# Patient Record
Sex: Male | Born: 1990 | Race: Black or African American | Hispanic: No | Marital: Single | Smoking: Current every day smoker
Health system: Southern US, Community
[De-identification: ages and names within clinical notes are randomized; demographics above are authoritative.]

## PROBLEM LIST (undated history)

## (undated) DIAGNOSIS — J189 Pneumonia, unspecified organism: Secondary | ICD-10-CM

---
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
## 2011-05-05 ENCOUNTER — Emergency Department (HOSPITAL_COMMUNITY): Payer: Medicaid Other

## 2011-05-05 ENCOUNTER — Emergency Department (HOSPITAL_COMMUNITY)
Admission: EM | Admit: 2011-05-05 | Discharge: 2011-05-06 | Disposition: A | Discharge status: Home / Self Care | Payer: Medicaid Other | Attending: Emergency Medicine | Admitting: Emergency Medicine

## 2011-05-05 ENCOUNTER — Encounter (HOSPITAL_COMMUNITY): Payer: Self-pay

## 2011-05-05 DIAGNOSIS — R197 Diarrhea, unspecified: Secondary | ICD-10-CM | POA: Insufficient documentation

## 2011-05-05 DIAGNOSIS — M549 Dorsalgia, unspecified: Secondary | ICD-10-CM | POA: Insufficient documentation

## 2011-05-05 DIAGNOSIS — R109 Unspecified abdominal pain: Secondary | ICD-10-CM | POA: Insufficient documentation

## 2011-05-05 DIAGNOSIS — Z8701 Personal history of pneumonia (recurrent): Secondary | ICD-10-CM | POA: Insufficient documentation

## 2011-05-05 DIAGNOSIS — R112 Nausea with vomiting, unspecified: Secondary | ICD-10-CM | POA: Insufficient documentation

## 2011-05-05 DIAGNOSIS — R509 Fever, unspecified: Secondary | ICD-10-CM | POA: Insufficient documentation

## 2011-05-05 HISTORY — DX: Pneumonia, unspecified organism: J18.9

## 2011-05-05 LAB — COMPREHENSIVE METABOLIC PANEL
ALT: 20 U/L (ref 0–53)
Alkaline Phosphatase: 98 U/L (ref 39–117)
GFR calc Af Amer: >90 mL/min (ref >90)
Glucose, Bld: 88 mg/dL (ref 70–99)
Potassium: 3.3 mEq/L — ABNORMAL LOW (ref 3.5–5.1)
Sodium: 134 mEq/L — ABNORMAL LOW (ref 135–145)
Total Protein: 8.2 g/dL (ref 6.0–8.3)

## 2011-05-05 LAB — URINALYSIS, ROUTINE W REFLEX MICROSCOPIC
Bilirubin Urine: NEGATIVE
Glucose, UA: NEGATIVE mg/dL
Ketones, ur: >80 mg/dL — AB
Nitrite: NEGATIVE
pH: 6 (ref 5.0–8.0)

## 2011-05-05 LAB — URINE MICROSCOPIC-ADD ON

## 2011-05-05 LAB — DIFFERENTIAL
Eosinophils Absolute: 0 10*3/uL (ref 0.0–0.7)
Lymphocytes Relative: 7 % — ABNORMAL LOW (ref 12–46)
Lymphs Abs: 0.9 10*3/uL (ref 0.7–4.0)
Neutro Abs: 12.2 10*3/uL — ABNORMAL HIGH (ref 1.7–7.7)
Neutrophils Relative %: 87 % — ABNORMAL HIGH (ref 43–77)

## 2011-05-05 LAB — CBC
MCH: 31.2 pg (ref 26.0–34.0)
Platelets: 182 10*3/uL (ref 150–400)
RBC: 5.57 MIL/uL (ref 4.22–5.81)
WBC: 14.1 10*3/uL — ABNORMAL HIGH (ref 4.0–10.5)

## 2011-05-05 MED ORDER — ONDANSETRON HCL 4 MG/2ML IJ SOLN
4.0000 mg | INTRAMUSCULAR | Status: DC | PRN
  Administered 2011-05-05: 4 mg via INTRAVENOUS
  Filled 2011-05-05: qty 2

## 2011-05-05 MED ORDER — DICYCLOMINE HCL 20 MG PO TABS: 20.0000 mg | ORAL_TABLET | Freq: Four times a day (QID) | ORAL | Status: DC | PRN

## 2011-05-05 MED ORDER — MORPHINE SULFATE 4 MG/ML IJ SOLN
4.0000 mg | INTRAMUSCULAR | Status: DC | PRN
  Administered 2011-05-05: 4 mg via INTRAVENOUS
  Filled 2011-05-05: qty 1

## 2011-05-05 MED ORDER — DICYCLOMINE HCL 20 MG PO TABS: 20.0000 mg | ORAL_TABLET | Freq: Four times a day (QID) | ORAL | Status: DC

## 2011-05-05 MED ORDER — DICYCLOMINE HCL 10 MG/ML IM SOLN
20.0000 mg | Freq: Once | INTRAMUSCULAR | Status: AC
  Administered 2011-05-05: 20 mg via INTRAMUSCULAR
  Filled 2011-05-05: qty 4

## 2011-05-05 MED ORDER — FAMOTIDINE IN NACL 20-0.9 MG/50ML-% IV SOLN
20.0000 mg | Freq: Once | INTRAVENOUS | Status: AC
  Administered 2011-05-05: 20 mg via INTRAVENOUS
  Filled 2011-05-05: qty 50

## 2011-05-05 MED ORDER — SODIUM CHLORIDE 0.9 % IV BOLUS (SEPSIS)
500.0000 mL | Freq: Once | INTRAVENOUS | Status: AC
  Administered 2011-05-05: 500 mL via INTRAVENOUS

## 2011-05-05 MED ORDER — ONDANSETRON HCL 4 MG PO TABS: 4.0000 mg | ORAL_TABLET | Freq: Four times a day (QID) | ORAL | Status: AC | PRN

## 2011-05-05 NOTE — ED Notes (Signed)
Acute onset on Friday with symptoms

## 2011-05-05 NOTE — ED Provider Notes (Signed)
History     CSN: [id number]  Arrival date & time 05/05/11  4098   First MD Initiated Contact with Patient 05/05/11 1917      Chief Complaint  Patient presents with  . Abdominal Pain  . Nausea  . Emesis  . Diarrhea  . Fever  . Back Pain    HPI Pt was seen at 2015.  Per pt, c/o gradual onset and persistence of multiple intermittent episodes of N/V/D x2 days.  Has been assoc with generalized abd "pain."  Describes the abd pain as "cramping."  Denies fevers, no rash, no CP/SOB, no back pain, no black or blood in stools or emesis.     Past Medical History  Diagnosis Date  . Pneumonia     History reviewed. No pertinent past surgical history.   History  Substance Use Topics  . Smoking status: Current Everyday Smoker  . Smokeless tobacco: Not on file  . Alcohol Use: Yes    Review of Systems ROS: Statement: All systems negative except as marked or noted in the HPI; Constitutional: Negative for fever and chills. ; ; Eyes: Negative for eye pain, redness and discharge. ; ; ENMT: Negative for ear pain, hoarseness, nasal congestion, sinus pressure and sore throat. ; ; Cardiovascular: Negative for chest pain, palpitations, diaphoresis, dyspnea and peripheral edema. ; ; Respiratory: Negative for cough, wheezing and stridor. ; ; Gastrointestinal: +N/V/D, abd pain. Negative for blood in stool, hematemesis, jaundice and rectal bleeding. . ; ; Genitourinary: Negative for dysuria, flank pain and hematuria. ; Genital:  No penile drainage or rash, no testicular pain or swelling, no scrotal rash or swelling. ; Musculoskeletal: Negative for back pain and neck pain. Negative for swelling and trauma.; ; Skin: Negative for pruritus, rash, abrasions, blisters, bruising and skin lesion.; ; Neuro: Negative for headache, lightheadedness and neck stiffness. Negative for weakness, altered level of consciousness , altered mental status, extremity weakness, paresthesias, involuntary movement, seizure and syncope.         Allergies  Review of patient's allergies indicates no known allergies.  Home Medications   Current Outpatient Rx  Name Route Sig Dispense Refill  . IBUPROFEN 200 MG PO TABS Oral Take 200 mg by mouth every 6 (six) hours as needed. For pain relief    . PSEUDOEPHEDRINE-APAP-DM 11-914-78 MG/30ML PO LIQD Oral Take 2 capsules by mouth every 4 (four) hours as needed. For cold/flu symptom relief      BP 150/76  Pulse 83  Temp(Src) 99.8 F (37.7 C) (Oral)  Resp 16  SpO2 100%  Physical Exam 2020: Physical examination:  Nursing notes reviewed; Vital signs and O2 SAT reviewed;  Constitutional: Well developed, Well nourished, Well hydrated, In no acute distress; Head:  Normocephalic, atraumatic; Eyes: EOMI, PERRL, No scleral icterus; ENMT: Mouth and pharynx normal, Mucous membranes moist; Neck: Supple, Full range of motion, No lymphadenopathy; Cardiovascular: Regular rate and rhythm, No murmur, rub, or gallop; Respiratory: Breath sounds clear & equal bilaterally, No rales, rhonchi, wheezes, or rub, Normal respiratory effort/excursion; Chest: Nontender, Movement normal; Abdomen: Soft, +mild diffuse tenderness to palp, no rebound or guarding, Nondistended, Normal bowel sounds; Genitourinary: No CVA tenderness; Extremities: Pulses normal, No tenderness, No edema, No calf edema or asymmetry.; Neuro: AA&Ox3, Major CN grossly intact.  No gross focal motor or sensory deficits in extremities.; Skin: Color normal, Warm, Dry, no rash.    ED Course  Procedures    MDM  MDM Reviewed: nursing note and vitals Interpretation: labs and x-ray   Results  for orders placed during the hospital encounter of 05/05/11  CBC      Component Value Range   WBC 14.1 (*) 4.0 - 10.5 (K/uL)   RBC 5.57  4.22 - 5.81 (MIL/uL)   Hemoglobin 17.4 (*) 13.0 - 17.0 (g/dL)   HCT 96.0  45.4 - 09.8 (%)   MCV 86.7  78.0 - 100.0 (fL)   MCH 31.2  26.0 - 34.0 (pg)   MCHC 36.0  30.0 - 36.0 (g/dL)   RDW 11.9  14.7 - 82.9 (%)    Platelets 182  150 - 400 (K/uL)  DIFFERENTIAL      Component Value Range   Neutrophils Relative 87 (*) 43 - 77 (%)   Neutro Abs 12.2 (*) 1.7 - 7.7 (K/uL)   Lymphocytes Relative 7 (*) 12 - 46 (%)   Lymphs Abs 0.9  0.7 - 4.0 (K/uL)   Monocytes Relative 7  3 - 12 (%)   Monocytes Absolute 1.0  0.1 - 1.0 (K/uL)   Eosinophils Relative 0  0 - 5 (%)   Eosinophils Absolute 0.0  0.0 - 0.7 (K/uL)   Basophils Relative 0  0 - 1 (%)   Basophils Absolute 0.0  0.0 - 0.1 (K/uL)  COMPREHENSIVE METABOLIC PANEL      Component Value Range   Sodium 134 (*) 135 - 145 (mEq/L)   Potassium 3.3 (*) 3.5 - 5.1 (mEq/L)   Chloride 97  96 - 112 (mEq/L)   CO2 25  19 - 32 (mEq/L)   Glucose, Bld 88  70 - 99 (mg/dL)   BUN 9  6 - 23 (mg/dL)   Creatinine, Ser 5.62  0.50 - 1.35 (mg/dL)   Calcium 9.8  8.4 - 13.0 (mg/dL)   Total Protein 8.2  6.0 - 8.3 (g/dL)   Albumin 4.3  3.5 - 5.2 (g/dL)   AST 23  0 - 37 (U/L)   ALT 20  0 - 53 (U/L)   Alkaline Phosphatase 98  39 - 117 (U/L)   Total Bilirubin 0.4  0.3 - 1.2 (mg/dL)   GFR calc non Af Amer >90  >90 (mL/min)   GFR calc Af Amer >90  >90 (mL/min)  URINALYSIS, ROUTINE W REFLEX MICROSCOPIC      Component Value Range   Color, Urine AMBER (*) YELLOW    APPearance CLEAR  CLEAR    Specific Gravity, Urine 1.025  1.005 - 1.030    pH 6.0  5.0 - 8.0    Glucose, UA NEGATIVE  NEGATIVE (mg/dL)   Hgb urine dipstick SMALL (*) NEGATIVE    Bilirubin Urine NEGATIVE  NEGATIVE    Ketones, ur >80 (*) NEGATIVE (mg/dL)   Protein, ur NEGATIVE  NEGATIVE (mg/dL)   Urobilinogen, UA 0.2  0.0 - 1.0 (mg/dL)   Nitrite NEGATIVE  NEGATIVE    Leukocytes, UA NEGATIVE  NEGATIVE   URINE MICROSCOPIC-ADD ON      Component Value Range   WBC, UA 0-2  <3 (WBC/hpf)   Bacteria, UA RARE  RARE    Urine-Other MUCOUS PRESENT    LIPASE, BLOOD      Component Value Range   Lipase 15  11 - 59 (U/L)   Dg Abd Acute W/chest 05/05/2011  *RADIOLOGY REPORT*  Clinical Data: Abdominal pain with nausea, vomiting,  diarrhea, back pain, and fever.  ACUTE ABDOMEN SERIES (ABDOMEN 2 VIEW & CHEST 1 VIEW)  Comparison: Chest x-ray dated 07/08/2010 and lumbar radiographs dated 09/01/2007  Findings: Heart and lungs appear normal.  No  free air in the abdomen.  No significantly dilated loops of large or small bowel. No abnormal abdominal calcifications.  No acute osseous abnormality.  IMPRESSION: No acute abnormality of the abdomen or chest.  Original Report Authenticated By: Gwynn Burly, M.D.       11:27 PM:  Feels better and wants to go home now.  Has tol PO well without N/V.  Has ambulated around the ED without distress.  Dx testing d/w pt and family.  Questions answered.  Verb understanding, agreeable to d/c home with outpt f/u.          Laray Anger, DO 05/07/11 2348

## 2011-05-06 ENCOUNTER — Emergency Department (HOSPITAL_COMMUNITY)
Admission: EM | Admit: 2011-05-06 | Discharge: 2011-05-06 | Discharge status: Against Medical Advice | Payer: Medicaid Other | Attending: Emergency Medicine | Admitting: Emergency Medicine

## 2011-05-06 ENCOUNTER — Encounter (HOSPITAL_COMMUNITY): Payer: Self-pay | Admitting: Emergency Medicine

## 2011-05-06 DIAGNOSIS — R109 Unspecified abdominal pain: Secondary | ICD-10-CM | POA: Insufficient documentation

## 2011-05-06 NOTE — ED Notes (Signed)
Pt not available when this RN attempt to room pt

## 2011-05-06 NOTE — ED Notes (Signed)
Pt alert, nad, c/o lower abd pain, cramping, nausea, seen in ED yesterday, returns via EMS with cont c/o, resp even unlabored, skin pwd

## 2011-05-06 NOTE — ED Notes (Signed)
[id number]> Expected date:05/06/11<BR> Expected time:<BR> Means of arrival:<BR> Comments:<BR> EMS 33 Ptar - abd pain/ambulatory

## 2011-05-07 LAB — URINE CULTURE
Colony Count: NO GROWTH
Culture  Setup Time: 201303040431

## 2013-04-02 IMAGING — CR DG ABDOMEN ACUTE W/ 1V CHEST
3 series · 3 of 3 positions shown · non-contrast
Comparison: Chest x-ray dated 07/08/2010 and lumbar radiographs
dated 09/01/2007

CLINICAL DATA: Abdominal pain with nausea, vomiting, diarrhea, back
pain, and fever.

ACUTE ABDOMEN SERIES (ABDOMEN 2 VIEW & CHEST 1 VIEW)

[w chest pa]
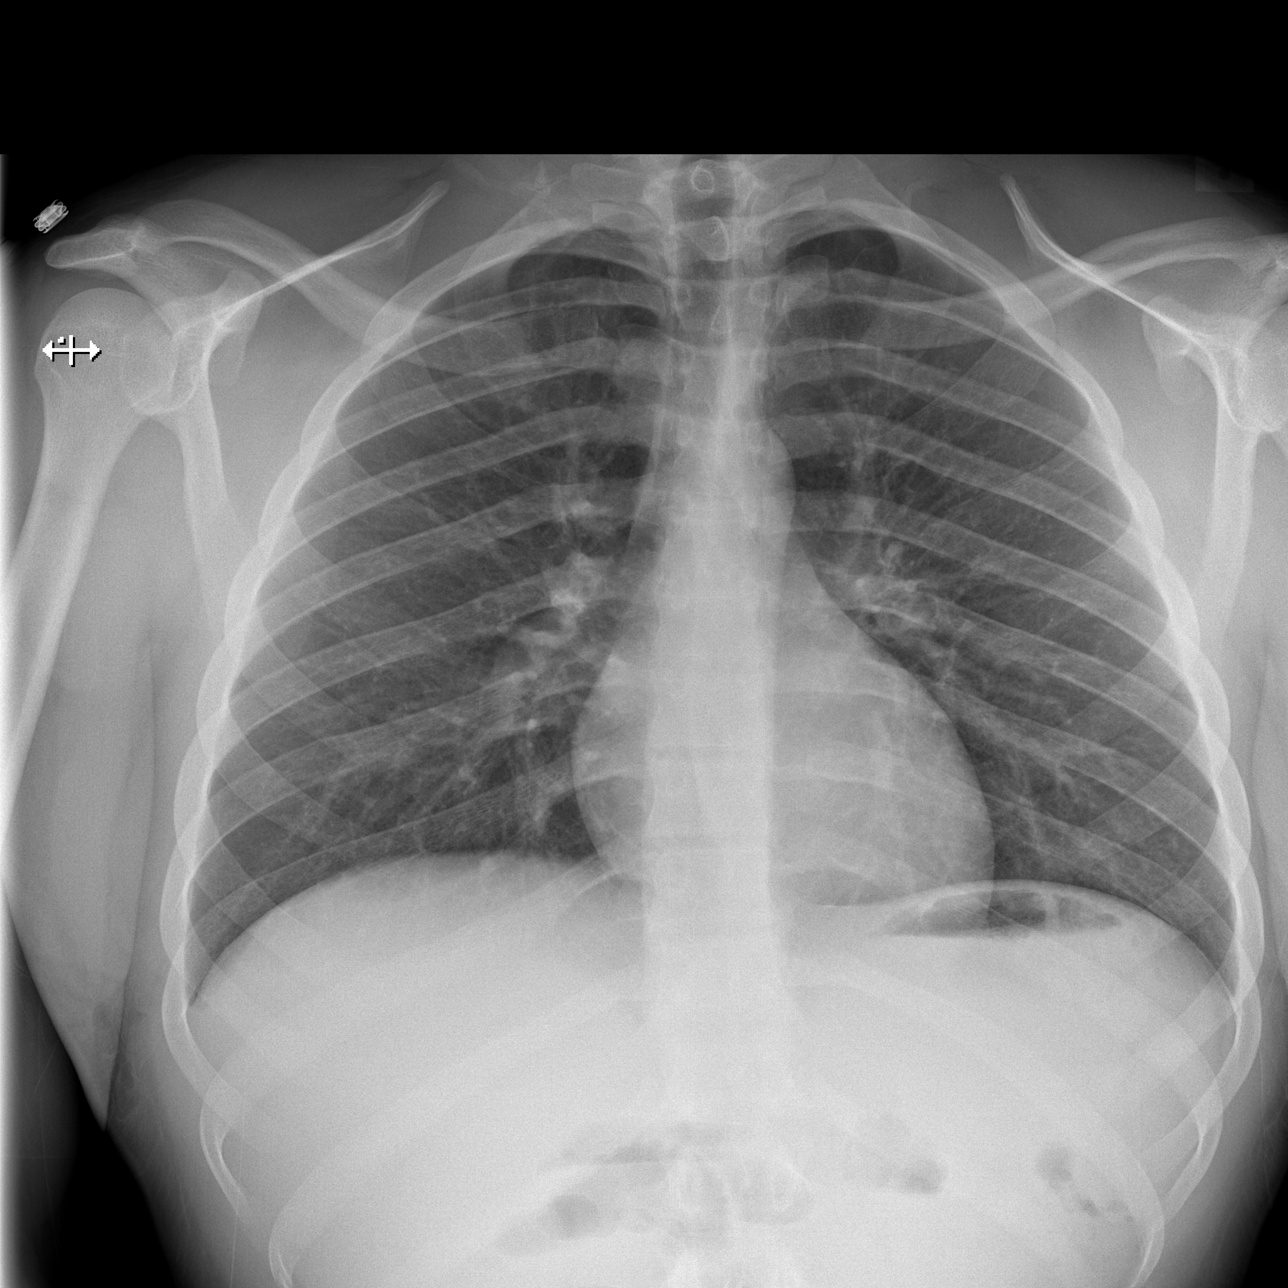

[w abdomen upright]
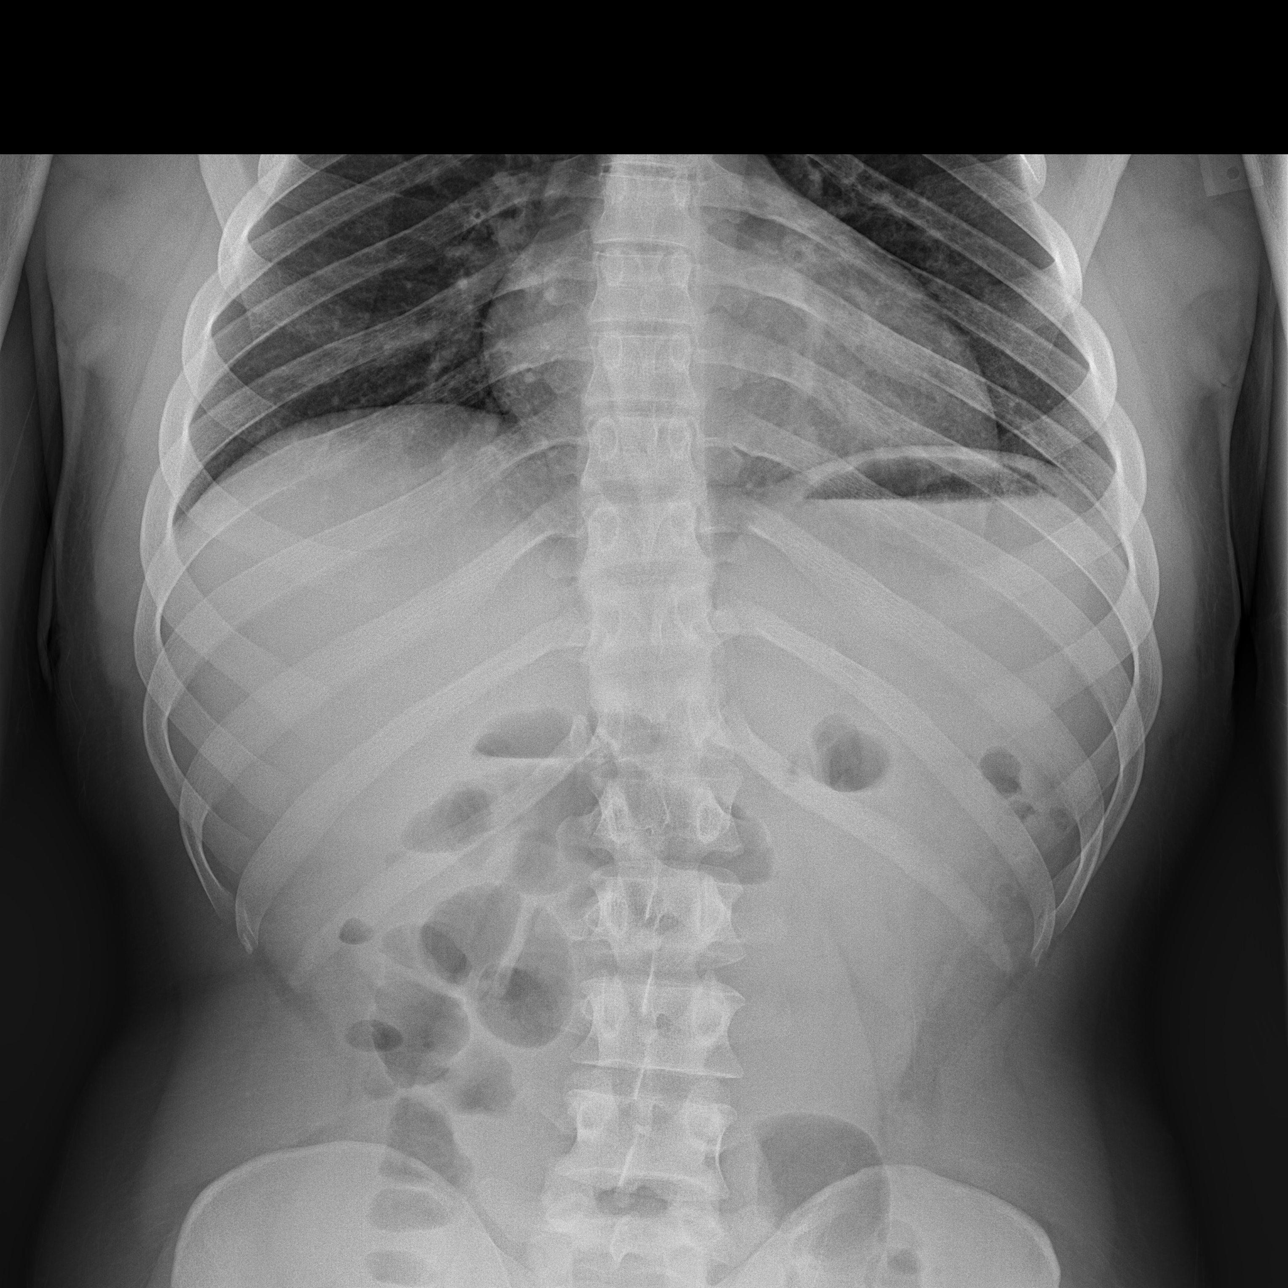

[t abdomen supine]
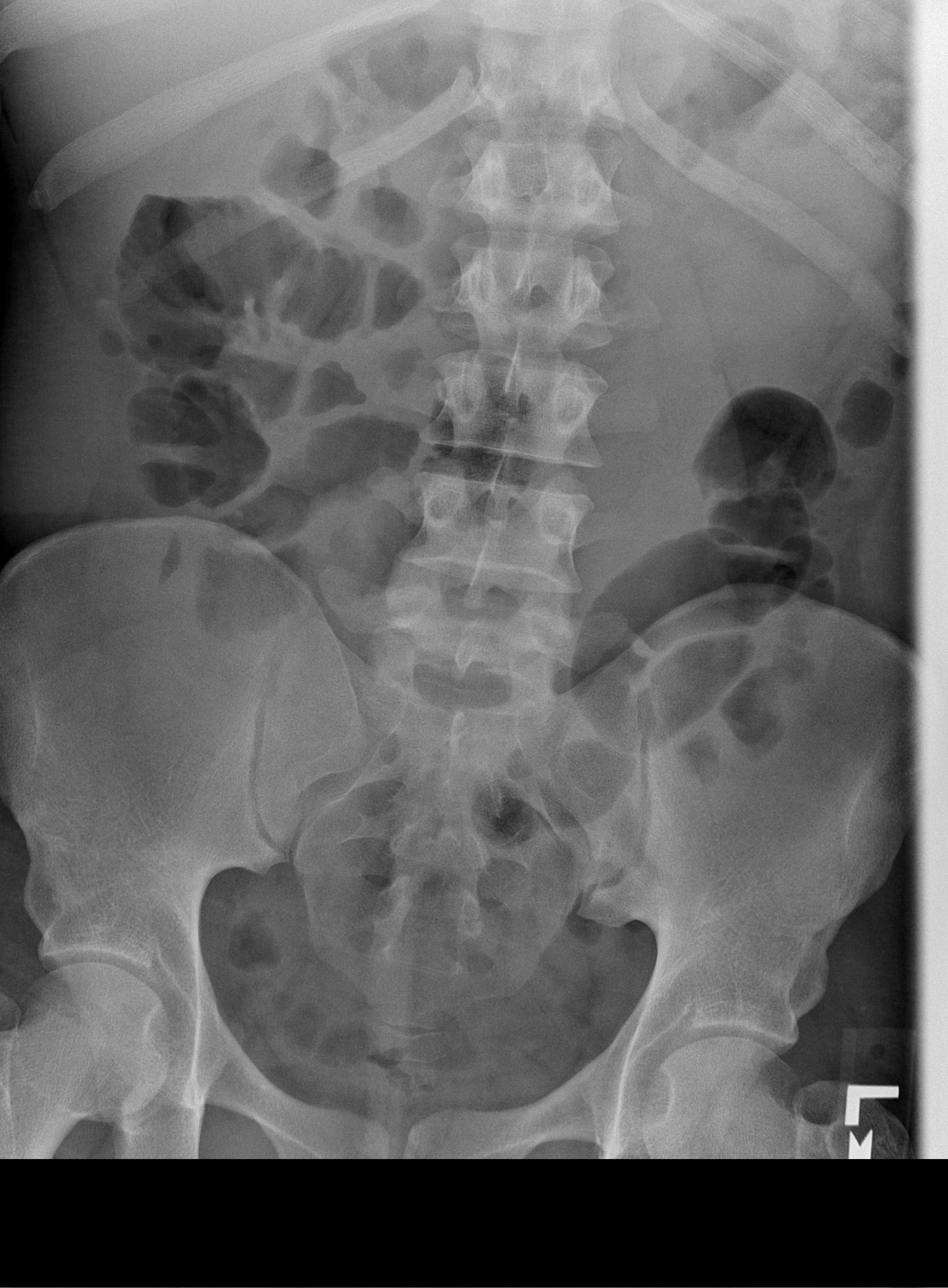

[3 of 3 positions shown; findings below may reference images not displayed]

FINDINGS: Heart and lungs appear normal.  No free air in the
abdomen.  No significantly dilated loops of large or small bowel.
No abnormal abdominal calcifications.  No acute osseous
abnormality.
IMPRESSION: No acute abnormality of the abdomen or chest.

## 2016-12-29 ENCOUNTER — Encounter (HOSPITAL_COMMUNITY): Payer: Self-pay

## 2016-12-29 ENCOUNTER — Emergency Department (HOSPITAL_COMMUNITY)
Admission: EM | Admit: 2016-12-29 | Discharge: 2016-12-29 | Disposition: A | Discharge status: Home / Self Care | Payer: Self-pay | Attending: Emergency Medicine | Admitting: Emergency Medicine

## 2016-12-29 DIAGNOSIS — M7918 Myalgia, other site: Secondary | ICD-10-CM | POA: Insufficient documentation

## 2016-12-29 DIAGNOSIS — F17210 Nicotine dependence, cigarettes, uncomplicated: Secondary | ICD-10-CM | POA: Insufficient documentation

## 2016-12-29 LAB — URINALYSIS, ROUTINE W REFLEX MICROSCOPIC
Bilirubin Urine: NEGATIVE
Glucose, UA: NEGATIVE mg/dL
Hgb urine dipstick: NEGATIVE
KETONES UR: NEGATIVE mg/dL
NITRITE: NEGATIVE
PH: 6.5 (ref 5.0–8.0)
PROTEIN: NEGATIVE mg/dL
Specific Gravity, Urine: 1.015 (ref 1.005–1.030)

## 2016-12-29 LAB — URINALYSIS, MICROSCOPIC (REFLEX)
Bacteria, UA: NONE SEEN
RBC / HPF: NONE SEEN RBC/hpf (ref 0–5)

## 2016-12-29 MED ORDER — IBUPROFEN 600 MG PO TABS: 600.0000 mg | ORAL_TABLET | Freq: Four times a day (QID) | ORAL | 0 refills | Status: AC | PRN

## 2016-12-29 MED ORDER — KETOROLAC TROMETHAMINE 60 MG/2ML IM SOLN
60.0000 mg | Freq: Once | INTRAMUSCULAR | Status: AC
  Administered 2016-12-29: 60 mg via INTRAMUSCULAR
  Filled 2016-12-29: qty 2

## 2016-12-29 MED ORDER — DIAZEPAM 5 MG PO TABS
5.0000 mg | ORAL_TABLET | Freq: Once | ORAL | Status: AC
  Administered 2016-12-29: 5 mg via ORAL
  Filled 2016-12-29: qty 1

## 2016-12-29 MED ORDER — METAXALONE 800 MG PO TABS: 800.0000 mg | ORAL_TABLET | Freq: Three times a day (TID) | ORAL | 0 refills | Status: DC

## 2016-12-29 NOTE — ED Notes (Signed)
ED Provider at bedside. 

## 2016-12-29 NOTE — ED Provider Notes (Signed)
[facility]-EMERGENCY DEPT Provider Note   CSN: [id number] Arrival date & time: 12/29/16  [id number]     History   Chief Complaint Chief Complaint  Patient presents with  . Flank Pain    Right Side    HPI Richard Patel is a 26 y.o. male.  26 year old male presents with several days of right-sided flank pain which is worse with certain positions and better with remaining still.  Denies any dysuria or hematuria.  Denies any history of trauma.  No radicular symptoms.  He notices the pain during his sleep when he changes positions.  Has used Tylenol without relief.  Denies any prior history of back injury or kidney stone.  He has not been short of breath has not had any right-sided chest pain.  Denies any rashes.  Called EMS and was transported here      Past Medical History:  Diagnosis Date  . Pneumonia     There are no active problems to display for this patient.   History reviewed. No pertinent surgical history.     Home Medications    Prior to Admission medications   Medication Sig Start Date End Date Taking? Authorizing Provider  dicyclomine (BENTYL) 20 MG tablet Take 1 tablet (20 mg total) by mouth every 6 (six) hours as needed (abdominal cramping). 05/05/11 05/04/12  Samuel JesterMcManus, Kathleen, DO  ibuprofen (ADVIL,MOTRIN) 200 MG tablet Take 200 mg by mouth every 6 (six) hours as needed. For pain relief    [provider]    Family History No family history on file.  Social History Social History  Substance Use Topics  . Smoking status: Current Every Day Smoker    Packs/day: 0.50    Years: 10.00    Types: Cigarettes  . Smokeless tobacco: Never Used     Comment: Will provide information on discharge  . Alcohol use No     Allergies   Patient has no known allergies.   Review of Systems Review of Systems  All other systems reviewed and are negative.    Physical Exam Updated Vital Signs BP 117/82 (BP Location: Left Arm)   Pulse  68   Temp 98.2 F (36.8 C) (Oral)   Resp 18   Ht 1.753 m (5\' 9" )   Wt 127 kg (280 lb)   SpO2 100%   BMI 41.35 kg/m   Physical Exam  Constitutional: He is oriented to person, place, and time. He appears well-developed and well-nourished.  Non-toxic appearance. No distress.  HENT:  Head: Normocephalic and atraumatic.  Eyes: Pupils are equal, round, and reactive to light. Conjunctivae, EOM and lids are normal.  Neck: Normal range of motion. Neck supple. No tracheal deviation present. No thyroid mass present.  Cardiovascular: Normal rate, regular rhythm and normal heart sounds.  Exam reveals no gallop.   No murmur heard. Pulmonary/Chest: Effort normal and breath sounds normal. No stridor. No respiratory distress. He has no decreased breath sounds. He has no wheezes. He has no rhonchi. He has no rales.  Abdominal: Soft. Normal appearance and bowel sounds are normal. He exhibits no distension. There is no tenderness. There is no rebound and no CVA tenderness.  Musculoskeletal: Normal range of motion. He exhibits no edema or tenderness.       Back:  Neurological: He is alert and oriented to person, place, and time. He has normal strength. No cranial nerve deficit or sensory deficit. GCS eye subscore is 4. GCS verbal subscore is 5. GCS motor subscore  is 6.  Skin: Skin is warm and dry. No abrasion and no rash noted.  Psychiatric: He has a normal mood and affect. His speech is normal and behavior is normal.  Nursing note and vitals reviewed.    ED Treatments / Results  Labs (all labs ordered are listed, but only abnormal results are displayed) Labs Reviewed  URINALYSIS, ROUTINE W REFLEX MICROSCOPIC    EKG  EKG Interpretation None       Radiology No results found.  Procedures Procedures (including critical care time)  Medications Ordered in ED Medications  ketorolac (TORADOL) injection 60 mg (not administered)  diazepam (VALIUM) tablet 5 mg (not administered)     Initial  Impression / Assessment and Plan / ED Course  I have reviewed the triage vital signs and the nursing notes.  Pertinent labs & imaging results that were available during my care of the patient were reviewed by me and considered in my medical decision making (see chart for details).     Patient treated for musculoskeletal back pain and feels better.  No urinary symptoms.  Will be discharged  Final Clinical Impressions(s) / ED Diagnoses   Final diagnoses:  None    New Prescriptions New Prescriptions   No medications on file     Lorre Nick, MD 12/29/16 1157

## 2016-12-29 NOTE — ED Notes (Signed)
Patient requesting to sleep alittle longer before giving urine sample.

## 2016-12-29 NOTE — ED Notes (Signed)
Pt denies SOB, Chest pain, nausea, vomiting.

## 2016-12-29 NOTE — ED Notes (Signed)
Bed: WA19 Expected date:  Expected time:  Means of arrival:  Comments: EMS 

## 2016-12-29 NOTE — ED Triage Notes (Signed)
Pt arrived Via [facility] w/ right sided flank pain. Pain has been going on for the past 3 days. Pain is worse in the morning and gradual decreases during the day but does not completely go away. Pt does on have Hx of Renal calculi, pt states pain is not relieved with Tylenol.

## 2017-09-18 ENCOUNTER — Ambulatory Visit (HOSPITAL_COMMUNITY)
Admission: EM | Admit: 2017-09-18 | Discharge: 2017-09-18 | Disposition: A | Discharge status: Home / Self Care | Payer: Self-pay | Attending: Family Medicine | Admitting: Family Medicine

## 2017-09-18 ENCOUNTER — Encounter (HOSPITAL_COMMUNITY): Payer: Self-pay | Admitting: Emergency Medicine

## 2017-09-18 ENCOUNTER — Ambulatory Visit (INDEPENDENT_AMBULATORY_CARE_PROVIDER_SITE_OTHER): Payer: Self-pay

## 2017-09-18 DIAGNOSIS — M25561 Pain in right knee: Secondary | ICD-10-CM

## 2017-09-18 MED ORDER — DICLOFENAC SODIUM 75 MG PO TBEC: 75.0000 mg | DELAYED_RELEASE_TABLET | Freq: Two times a day (BID) | ORAL | 0 refills | Status: DC

## 2017-09-18 NOTE — ED Triage Notes (Signed)
Pt sts right knee swelling and pain since slipping 4 days ago; pt sts hx of similar in past

## 2017-09-18 NOTE — ED Provider Notes (Addendum)
[facility]    CSN: [id number] Arrival date & time: 09/18/17  1107     History   Chief Complaint Chief Complaint  Patient presents with  . Knee Pain    HPI Richard Patel is a 27 y.o. male history of eczema presenting today for evaluation of right knee pain.  Patient states that years ago he injured his knee and felt like his kneecap was was out of place, he states he did his own physical therapy and symptoms improved.  More recently he fell onto this knee which caused his symptoms to worsen again.  Notes his pain to be mostly by his lower inner thigh.  Difficulty and pain with bending and walking.  He will feel a pop.  HPI  Past Medical History:  Diagnosis Date  . Pneumonia     There are no active problems to display for this patient.   History reviewed. No pertinent surgical history.     Home Medications    Prior to Admission medications   Medication Sig Start Date End Date Taking? Authorizing Provider  acetaminophen (TYLENOL) 500 MG tablet Take 500 mg by mouth every 6 (six) hours as needed for mild pain.    [provider]  diclofenac (VOLTAREN) 75 MG EC tablet Take 1 tablet (75 mg total) by mouth 2 (two) times daily. 09/18/17   [place]op[place] Canty C, PA-C  dicyclomine (BENTYL) 20 MG tablet Take 1 tablet (20 mg total) by mouth every 6 (six) hours as needed (abdominal cramping). 05/05/11 05/04/12  Samuel JesterMcManus, Kathleen, DO  ibuprofen (ADVIL,MOTRIN) 600 MG tablet Take 1 tablet (600 mg total) by mouth every 6 (six) hours as needed. 12/29/16   Lorre NickAllen, Anthony, MD  metaxalone (SKELAXIN) 800 MG tablet Take 1 tablet (800 mg total) by mouth 3 (three) times daily. 12/29/16   Lorre NickAllen, Anthony, MD    Family History History reviewed. No pertinent family history.  Social History Social History   Tobacco Use  . Smoking status: Current Every Day Smoker    Packs/day: 0.50    Years: 10.00    Pack years: 5.00    Types: Cigarettes  . Smokeless tobacco: Never Used  .  Tobacco comment: Will provide information on discharge  Substance Use Topics  . Alcohol use: No  . Drug use: Yes    Types: Marijuana    Comment: Ocassional use     Allergies   Patient has no known allergies.   Review of Systems Review of Systems  Constitutional: Negative for activity change, chills, diaphoresis and fatigue.  Eyes: Negative for photophobia and visual disturbance.  Respiratory: Negative for cough, chest tightness and shortness of breath.   Cardiovascular: Negative for chest pain and leg swelling.  Gastrointestinal: Negative for nausea and vomiting.  Musculoskeletal: Positive for arthralgias, gait problem and myalgias. Negative for back pain, neck pain and neck stiffness.  Skin: Positive for rash. Negative for color change and wound.  Neurological: Negative for dizziness, weakness, light-headedness, numbness and headaches.     Physical Exam Triage Vital Signs ED Triage Vitals [09/18/17 1151]  Enc Vitals Group     BP (!) 150/82     Pulse Rate 81     Resp 16     Temp 98.5 F (36.9 C)     Temp Source Oral     SpO2 99 %     Weight      Height      Head Circumference      Peak Flow  Pain Score      Pain Loc      Pain Edu?      Excl. in GC?    No data found.  Updated Vital Signs BP (!) 150/82 (BP Location: Right Arm)   Pulse 81   Temp 98.5 F (36.9 C) (Oral)   Resp 16   SpO2 99%   Visual Acuity Right Eye Distance:   Left Eye Distance:   Bilateral Distance:    Right Eye Near:   Left Eye Near:    Bilateral Near:     Physical Exam  Constitutional: He is oriented to person, place, and time. He appears well-developed and well-nourished.  No acute distress  HENT:  Head: Normocephalic and atraumatic.  Nose: Nose normal.  Eyes: Conjunctivae are normal.  Neck: Neck supple.  Cardiovascular: Normal rate.  Pulmonary/Chest: Effort normal. No respiratory distress.  Abdominal: He exhibits no distension.  Musculoskeletal: Normal range of motion.   Right knee: Patella less mobile than on left side; limited flexion, patient resisting motion due to pain, no varus or valgus laxity appreciated, negative Lachman, negative anterior and posterior drawer.  Neurological: He is alert and oriented to person, place, and time.  Skin: Skin is warm and dry.  Significant eczematous patches to bilateral lower extremities  Psychiatric: He has a normal mood and affect.  Nursing note and vitals reviewed.    UC Treatments / Results  Labs (all labs ordered are listed, but only abnormal results are displayed) Labs Reviewed - No data to display  EKG None  Radiology Dg Knee Complete 4 Views Right  Result Date: 09/18/2017 CLINICAL DATA:  Knee pain.  Fall 3 days ago EXAM: RIGHT KNEE - COMPLETE 4+ VIEW COMPARISON:  07/19/2005 FINDINGS: Negative for fracture Joint space narrowing and irregularity in the lateral compartment and the patellofemoral joint. Calcified loose bodies in the joint. Moderate joint effusion. IMPRESSION: Moderate degenerative change with calcified loose bodies and joint effusion. Electronically Signed   By: Marlan Palau M.D.   On: 09/18/2017 12:55    Procedures Procedures (including critical care time)  Medications Ordered in UC Medications - No data to display  Initial Impression / Assessment and Plan / UC Course  I have reviewed the triage vital signs and the nursing notes.  Pertinent labs & imaging results that were available during my care of the patient were reviewed by me and considered in my medical decision making (see chart for details).     X-ray showing moderate degenerative changes with loose bodies effusion.  This bodies have likely been there and less likely the cause of his acute pain today.  Will have patient follow-up with orthopedics for further treatment and evaluation.  Diclofenac today, continue anti-inflammatories, rest, ice and elevation.  Weightbearing as tolerating.Discussed strict return precautions.  Patient verbalized understanding and is agreeable with plan.  Final Clinical Impressions(s) / UC Diagnoses   Final diagnoses:  Acute pain of right knee     Discharge Instructions     Please use diclofenac twice daily for pain  Ice and elevate knee  Follow up with orthopedics if pain persisting    ED Prescriptions    Medication Sig Dispense Auth. Provider   diclofenac (VOLTAREN) 75 MG EC tablet Take 1 tablet (75 mg total) by mouth 2 (two) times daily. 30 tablet Destine Zirkle, Kirkwood C, PA-C     Controlled Substance Prescriptions [place] Controlled Substance Registry consulted? Not Applicable   Foy [place] 09/18/17 1321    Keshanna Riso, Bruno  C, PA-C 09/18/17 1321

## 2017-09-18 NOTE — Discharge Instructions (Signed)
Please use diclofenac twice daily for pain  Ice and elevate knee  Follow up with orthopedics if pain persisting

## 2017-09-18 NOTE — ED Notes (Signed)
Knee splint applied by taylor, cna

## 2019-06-28 ENCOUNTER — Emergency Department (HOSPITAL_COMMUNITY): Payer: Self-pay

## 2019-06-28 ENCOUNTER — Emergency Department (HOSPITAL_COMMUNITY)
Admission: EM | Admit: 2019-06-28 | Discharge: 2019-06-28 | Payer: Self-pay | Attending: Emergency Medicine | Admitting: Emergency Medicine

## 2019-06-28 ENCOUNTER — Other Ambulatory Visit: Payer: Self-pay

## 2019-06-28 DIAGNOSIS — Z20822 Contact with and (suspected) exposure to covid-19: Secondary | ICD-10-CM | POA: Insufficient documentation

## 2019-06-28 DIAGNOSIS — F17210 Nicotine dependence, cigarettes, uncomplicated: Secondary | ICD-10-CM | POA: Insufficient documentation

## 2019-06-28 DIAGNOSIS — R0789 Other chest pain: Secondary | ICD-10-CM | POA: Insufficient documentation

## 2019-06-28 DIAGNOSIS — F419 Anxiety disorder, unspecified: Secondary | ICD-10-CM | POA: Insufficient documentation

## 2019-06-28 LAB — TROPONIN I (HIGH SENSITIVITY): Troponin I (High Sensitivity): 3 ng/L (ref <18)

## 2019-06-28 LAB — BASIC METABOLIC PANEL
Anion gap: 12 (ref 5–15)
BUN: 12 mg/dL (ref 6–20)
CO2: 20 mmol/L — ABNORMAL LOW (ref 22–32)
Calcium: 9.3 mg/dL (ref 8.9–10.3)
Chloride: 107 mmol/L (ref 98–111)
Creatinine, Ser: 0.78 mg/dL (ref 0.61–1.24)
GFR calc Af Amer: >60 mL/min (ref >60)
GFR calc non Af Amer: >60 mL/min (ref >60)
Glucose, Bld: 88 mg/dL (ref 70–99)
Potassium: 4 mmol/L (ref 3.5–5.1)
Sodium: 139 mmol/L (ref 135–145)

## 2019-06-28 LAB — CBC
HCT: 51.8 % (ref 39.0–52.0)
Hemoglobin: 17.4 g/dL — ABNORMAL HIGH (ref 13.0–17.0)
MCH: 31.7 pg (ref 26.0–34.0)
MCHC: 33.6 g/dL (ref 30.0–36.0)
MCV: 94.4 fL (ref 80.0–100.0)
Platelets: 199 10*3/uL (ref 150–400)
RBC: 5.49 MIL/uL (ref 4.22–5.81)
RDW: 13.2 % (ref 11.5–15.5)
WBC: 8.2 10*3/uL (ref 4.0–10.5)
nRBC: 0 % (ref 0.0–0.2)

## 2019-06-28 LAB — POC SARS CORONAVIRUS 2 AG -  ED: SARS Coronavirus 2 Ag: NEGATIVE

## 2019-06-28 MED ORDER — IBUPROFEN 800 MG PO TABS: 800.0000 mg | ORAL_TABLET | Freq: Three times a day (TID) | ORAL | 0 refills | Status: AC | PRN

## 2019-06-28 MED ORDER — SODIUM CHLORIDE 0.9% FLUSH: 3.0000 mL | Freq: Once | INTRAVENOUS | Status: DC

## 2019-06-28 MED ORDER — IBUPROFEN 800 MG PO TABS
800.0000 mg | ORAL_TABLET | Freq: Once | ORAL | Status: AC
  Administered 2019-06-28: 15:00:00 800 mg via ORAL
  Filled 2019-06-28: qty 1

## 2019-06-28 NOTE — ED Triage Notes (Signed)
[place] from courthouse, handcuffed and accompanied by parole officer and sheriff's deputy. Per EMS, pt had panic attack after notification he would be arrested. Endorses shortness of breath then began having chest pain en route.

## 2019-06-28 NOTE — ED Notes (Signed)
Pt verbalizes understanding of discharge instructions, discharged NAD in police custody.

## 2019-06-28 NOTE — Discharge Instructions (Signed)
Return if any problems.

## 2019-06-29 NOTE — ED Provider Notes (Signed)
[name] EMERGENCY DEPARTMENT Provider Note   CSN: [id number] Arrival date & time: 06/28/19  1028     History Chief Complaint  Patient presents with  . Panic Attack  . Chest Pain  . Shortness of Breath    Richard Patel is a 29 y.o. male.  The history is provided by the patient. No language interpreter was used.  Shortness of Breath Associated symptoms: chest pain   Anxiety This is a new problem. The current episode started 3 to 5 hours ago. The problem occurs constantly. The problem has been gradually worsening. Associated symptoms include chest pain. Nothing aggravates the symptoms. He has tried nothing for the symptoms. The treatment provided no relief.  Pt in custody and is going to jail.  Pt reports he has been having some chest pain for over a week.  Pt states he has some pain with movement.      Past Medical History:  Diagnosis Date  . Pneumonia     There are no problems to display for this patient.   No past surgical history on file.     No family history on file.  Social History   Tobacco Use  . Smoking status: Current Every Day Smoker    Packs/day: 0.50    Years: 10.00    Pack years: 5.00    Types: Cigarettes  . Smokeless tobacco: Never Used  . Tobacco comment: Will provide information on discharge  Substance Use Topics  . Alcohol use: No  . Drug use: Yes    Types: Marijuana    Comment: Ocassional use    Home Medications Prior to Admission medications   Medication Sig Start Date End Date Taking? Authorizing Provider  ibuprofen (ADVIL) 800 MG tablet Take 1 tablet (800 mg total) by mouth every 8 (eight) hours as needed. 06/28/19   Elson Areas, PA-C  ibuprofen (ADVIL,MOTRIN) 600 MG tablet Take 1 tablet (600 mg total) by mouth every 6 (six) hours as needed. 12/29/16   Lorre Nick, MD  dicyclomine (BENTYL) 20 MG tablet Take 1 tablet (20 mg total) by mouth every 6 (six) hours as needed (abdominal cramping). 05/05/11 06/28/19   Samuel Jester, DO    Allergies    Patient has no known allergies.  Review of Systems   Review of Systems  Cardiovascular: Positive for chest pain.  All other systems reviewed and are negative.   Physical Exam Updated Vital Signs BP (!) 141/70 (BP Location: Right Arm)   Pulse (!) 56   Temp 98.1 F (36.7 C) (Oral)   Resp 18   SpO2 100%   Physical Exam Vitals and nursing note reviewed.  Constitutional:      Appearance: He is well-developed.  HENT:     Head: Normocephalic and atraumatic.  Eyes:     Conjunctiva/sclera: Conjunctivae normal.  Cardiovascular:     Rate and Rhythm: Normal rate and regular rhythm.     Heart sounds: No murmur.  Pulmonary:     Effort: Pulmonary effort is normal. No respiratory distress.     Breath sounds: Normal breath sounds.  Abdominal:     Palpations: Abdomen is soft.     Tenderness: There is no abdominal tenderness.  Musculoskeletal:     Cervical back: Neck supple.  Skin:    General: Skin is warm and dry.  Neurological:     General: No focal deficit present.     Mental Status: He is alert.     ED Results / Procedures /  Treatments   Labs (all labs ordered are listed, but only abnormal results are displayed) Labs Reviewed  BASIC METABOLIC PANEL - Abnormal; Notable for the following components:      Result Value   CO2 20 (*)    All other components within normal limits  CBC - Abnormal; Notable for the following components:   Hemoglobin 17.4 (*)    All other components within normal limits  POC SARS CORONAVIRUS 2 AG -  ED  TROPONIN I (HIGH SENSITIVITY)    EKG EKG Interpretation  Date/Time:  Monday June 28 2019 10:33:22 EDT Ventricular Rate:  66 PR Interval:  144 QRS Duration: 82 QT Interval:  386 QTC Calculation: 404 R Axis:   88 Text Interpretation: Normal sinus rhythm with sinus arrhythmia Early repolarization Confirmed by Randal Buba, April ([id number]) on 06/29/2019 10:13:21 AM   Radiology DG Chest 2 View  Result  Date: 06/28/2019 CLINICAL DATA:  Chest pain, shortness of breath and cough for 1 week. EXAM: CHEST - 2 VIEW COMPARISON:  07/08/2010 FINDINGS: The cardiac silhouette, mediastinal and hilar contours are normal. The lungs are clear. No pleural effusion. No pulmonary lesions. No pneumothorax. The bony thorax is intact. IMPRESSION: No acute cardiopulmonary findings. Electronically Signed   By: Marijo Sanes M.D.   On: 06/28/2019 11:45    Procedures Procedures (including critical care time)  Medications Ordered in ED Medications  ibuprofen (ADVIL) tablet 800 mg (800 mg Oral Given 06/28/19 1516)    ED Course  I have reviewed the triage vital signs and the nursing notes.  Pertinent labs & imaging results that were available during my care of the patient were reviewed by me and considered in my medical decision making (see chart for details).    MDM Rules/Calculators/A&P                      MDM:EKG is normal chest xray is normal.  Troponin is normal x 1.  Pt's pain is reproducible.  Pt given ibuprofen here.   Final Clinical Impression(s) / ED Diagnoses Final diagnoses:  Anxiety  Chest wall discomfort    Rx / DC Orders ED Discharge Orders         Ordered    ibuprofen (ADVIL) 800 MG tablet  Every 8 hours PRN     06/28/19 1600        An After Visit Summary was printed and given to the patient.    Sidney Ace 06/29/19 2138    Blanchie Dessert, MD 07/01/19 2201

## 2021-04-26 ENCOUNTER — Emergency Department (HOSPITAL_COMMUNITY): Payer: Self-pay

## 2021-04-26 ENCOUNTER — Emergency Department (HOSPITAL_COMMUNITY)
Admission: EM | Admit: 2021-04-26 | Discharge: 2021-04-26 | Disposition: A | Discharge status: Home / Self Care | Payer: Self-pay | Attending: Emergency Medicine | Admitting: Emergency Medicine

## 2021-04-26 ENCOUNTER — Encounter (HOSPITAL_COMMUNITY): Payer: Self-pay | Admitting: Emergency Medicine

## 2021-04-26 ENCOUNTER — Other Ambulatory Visit: Payer: Self-pay

## 2021-04-26 DIAGNOSIS — S63012A Subluxation of distal radioulnar joint of left wrist, initial encounter: Secondary | ICD-10-CM | POA: Insufficient documentation

## 2021-04-26 DIAGNOSIS — S63013A Subluxation of distal radioulnar joint of unspecified wrist, initial encounter: Secondary | ICD-10-CM

## 2021-04-26 DIAGNOSIS — W228XXA Striking against or struck by other objects, initial encounter: Secondary | ICD-10-CM | POA: Insufficient documentation

## 2021-04-26 DIAGNOSIS — M25532 Pain in left wrist: Secondary | ICD-10-CM | POA: Insufficient documentation

## 2021-04-26 MED ORDER — OXYCODONE-ACETAMINOPHEN 5-325 MG PO TABS
1.0000 | ORAL_TABLET | Freq: Once | ORAL | Status: AC
  Administered 2021-04-26: 1 via ORAL
  Filled 2021-04-26: qty 1

## 2021-04-26 NOTE — ED Provider Notes (Signed)
[facility] EMERGENCY DEPARTMENT Provider Note   CSN: [id number] Arrival date & time: 04/26/21  0433     History  Chief Complaint  Patient presents with   Wrist Injury    Richard Patel is a 31 y.o. male.  HPI     This is a 31 year old male with no reported past medical history who presents with left wrist pain.  He is right-handed.  Patient reports that he got angry last night and hit his left wrist up against a wall.  He noted swelling and pain but thought it would get better.  States that he had throbbing pain throughout the night.  Denies numbness or tingling of the hand.  Denies other injury.  Home Medications Prior to Admission medications   Medication Sig Start Date End Date Taking? Authorizing Provider  ibuprofen (ADVIL) 800 MG tablet Take 1 tablet (800 mg total) by mouth every 8 (eight) hours as needed. 06/28/19   Elson Areas, PA-C  ibuprofen (ADVIL,MOTRIN) 600 MG tablet Take 1 tablet (600 mg total) by mouth every 6 (six) hours as needed. 12/29/16   Lorre Nick, MD  dicyclomine (BENTYL) 20 MG tablet Take 1 tablet (20 mg total) by mouth every 6 (six) hours as needed (abdominal cramping). 05/05/11 06/28/19  Samuel Jester, DO      Allergies    Patient has no known allergies.    Review of Systems   Review of Systems  Musculoskeletal:        Left wrist pain  All other systems reviewed and are negative.  Physical Exam Updated Vital Signs BP 118/64    Pulse 88    Temp 98.6 F (37 C) (Oral)    Resp 18    Ht 1.829 m (6')    Wt 98 kg    SpO2 99%    BMI 29.30 kg/m  Physical Exam Vitals and nursing note reviewed.  Constitutional:      Appearance: He is well-developed. He is not ill-appearing.  HENT:     Head: Normocephalic and atraumatic.     Mouth/Throat:     Mouth: Mucous membranes are moist.  Eyes:     Pupils: Pupils are equal, round, and reactive to light.  Cardiovascular:     Rate and Rhythm: Normal rate and regular rhythm.   Pulmonary:     Effort: Pulmonary effort is normal. No respiratory distress.  Abdominal:     Palpations: Abdomen is soft.  Musculoskeletal:     Cervical back: Neck supple.     Comments: Tenderness palpation over the distal ulna with slight prominence, no obvious deformity, slight swelling, 2+ radial pulse, neurovascular intact distal  Lymphadenopathy:     Cervical: No cervical adenopathy.  Skin:    General: Skin is warm and dry.  Neurological:     Mental Status: He is alert and oriented to person, place, and time.  Psychiatric:        Mood and Affect: Mood normal.    ED Results / Procedures / Treatments   Labs (all labs ordered are listed, but only abnormal results are displayed) Labs Reviewed - No data to display  EKG None  Radiology DG Wrist Complete Left  Result Date: 04/26/2021 CLINICAL DATA:  31 year old male with history of left wrist injury and left wrist pain. EXAM: LEFT WRIST - COMPLETE 3+ VIEW COMPARISON:  No priors. FINDINGS: Three nonstandard views are submitted for evaluation. At the distal radioulnar joint there appears to be some dorsal subluxation of the  ulna relative to the radius on the lateral view (please note, however, that the lateral view is slightly obliqued). No acute displaced fracture. IMPRESSION: 1. Findings are concerning for disruption of the distal radioulnar joint with dorsal subluxation of the ulna, as above. 2. No acute fracture. Electronically Signed   By: Trudie Reed M.D.   On: 04/26/2021 06:23    Procedures Procedures    Medications Ordered in ED Medications  oxyCODONE-acetaminophen (PERCOCET/ROXICET) 5-325 MG per tablet 1 tablet (1 tablet Oral Given 04/26/21 0615)    ED Course/ Medical Decision Making/ A&P Clinical Course as of 04/26/21 0707  Thu Apr 26, 2021  0659 Spoke with Dr. Melvyn Novas regarding x-rays.  Will place in an ulnar gutter splint and have him follow-up. [CH]    Clinical Course User Index [CH] Annalyce Lanpher, Mayer Masker, MD                            Medical Decision Making Amount and/or Complexity of Data Reviewed Radiology: ordered.  Risk Prescription drug management.   This patient presents to the ED for concern of left wrist injury, this involves an extensive number of treatment options, and is a complaint that carries with it a high risk of complications and morbidity.  The differential diagnosis includes fracture, dislocation, sprain  MDM:    This is a 31 year old male who presents with left wrist injury.  Happened yesterday evening.  He is nontoxic and vital signs are reassuring.  He has tenderness over the distal ulna.  He is neurovascular intact.  Initial x-rays concerning for possible ulnar subluxation.  I requested a true lateral.  I did speak with Dr. Melvyn Novas who recommends splint placement and follow-up.  We will apply an ulnar gutter splint and give patient a sling. (Labs, imaging)  Labs: I Ordered, and personally interpreted labs.  The pertinent results include: None  Imaging Studies ordered: I ordered imaging studies including wrist x-ray I independently visualized and interpreted imaging. I agree with the radiologist interpretation  Additional history obtained from none.  External records from outside source obtained and reviewed including none  Critical Interventions: N/A  Consultations: I requested consultation with the hand surgery, Dr. Melvyn Novas,  and discussed lab and imaging findings as well as pertinent plan - they recommend: Splint placement and follow-up  Cardiac Monitoring: The patient was maintained on a cardiac monitor.  I personally viewed and interpreted the cardiac monitored which showed an underlying rhythm of: None  Reevaluation: After the interventions noted above, I reevaluated the patient and found that they have :stayed the same   Considered admission for: N/A  Social Determinants of Health: N/A  Disposition: Discharge  Co morbidities that complicate the  patient evaluation  Past Medical History:  Diagnosis Date   Pneumonia      Medicines Meds ordered this encounter  Medications   oxyCODONE-acetaminophen (PERCOCET/ROXICET) 5-325 MG per tablet 1 tablet    I have reviewed the patients home medicines and have made adjustments as needed  Problem List / ED Course: Problem List Items Addressed This Visit   None Visit Diagnoses     Subluxation of distal radial-ulnar joint, initial encounter    -  Primary                   Final Clinical Impression(s) / ED Diagnoses Final diagnoses:  Subluxation of distal radial-ulnar joint, initial encounter    Rx / DC Orders ED Discharge Orders  None         Shon Baton, MD 04/26/21 [phone]

## 2021-04-26 NOTE — Progress Notes (Signed)
Orthopedic Tech Progress Note Patient Details:  [name] 08-19-1990 [id number]  Ortho Devices Type of Ortho Device: Ulna gutter splint, Arm sling Ortho Device/Splint Location: LUE Ortho Device/Splint Interventions: Application, Adjustment, Ordered   Post Interventions Patient Tolerated: Well  Richard Patel 04/26/2021, 7:53 AM

## 2021-04-26 NOTE — ED Triage Notes (Signed)
Patient injured his left wrist when he punched a wall last night with pain and mild swelling .

## 2021-04-26 NOTE — ED Notes (Signed)
Ortho tech called 

## 2021-04-26 NOTE — Discharge Instructions (Signed)
You were seen today for an injury of your wrist.  You have some subluxation of the ulna at the wrist joint.  Keep splint in place and follow-up with hand surgery.  Ice and elevate as needed.
